# Patient Record
Sex: Female | Born: 1995 | Race: Black or African American | Hispanic: No | Marital: Single | Smoking: Never smoker
Health system: Southern US, Community
[De-identification: ages and names within clinical notes are randomized; demographics above are authoritative.]

---

## 2013-01-10 ENCOUNTER — Emergency Department (HOSPITAL_COMMUNITY)
Admission: EM | Admit: 2013-01-10 | Discharge: 2013-01-10 | Disposition: A | Discharge status: Home / Self Care | Payer: Commercial Managed Care - PPO | Attending: Emergency Medicine | Admitting: Emergency Medicine

## 2013-01-10 ENCOUNTER — Encounter (HOSPITAL_COMMUNITY): Payer: Self-pay | Admitting: Emergency Medicine

## 2013-01-10 ENCOUNTER — Other Ambulatory Visit: Payer: Self-pay

## 2013-01-10 DIAGNOSIS — R05 Cough: Secondary | ICD-10-CM | POA: Insufficient documentation

## 2013-01-10 DIAGNOSIS — J3489 Other specified disorders of nose and nasal sinuses: Secondary | ICD-10-CM | POA: Insufficient documentation

## 2013-01-10 DIAGNOSIS — R079 Chest pain, unspecified: Secondary | ICD-10-CM

## 2013-01-10 DIAGNOSIS — R059 Cough, unspecified: Secondary | ICD-10-CM | POA: Insufficient documentation

## 2013-01-10 DIAGNOSIS — R0789 Other chest pain: Secondary | ICD-10-CM | POA: Insufficient documentation

## 2013-01-10 MED ORDER — HYDROCODONE-ACETAMINOPHEN 5-325 MG PO TABS: 1.0000 | ORAL_TABLET | ORAL | Status: DC | PRN

## 2013-01-10 NOTE — ED Provider Notes (Signed)
CSN: [id number]     Arrival date & time 01/10/13  1829 History   First MD Initiated Contact with Patient 01/10/13 2101     Chief Complaint  Patient presents with  . Sore Throat  . Nasal Congestion   (Consider location/radiation/quality/duration/timing/severity/associated sxs/prior Treatment) HPI History provided by pt.   Has had intermittent, non-radiating, substernal chest pain since 10am today.  Started while sitting in a chair at work.  Can not describe sensation.  Associated w/ SOB.  Sx worsen w/ exertion and sitting/standing up straight.  Non-pleuritic.  Has had mild cough productive of yellow sputum as well a sore throat and nasal congestion for the past 2 days.   Denies fever, hemoptysis, abdominal pain, nausea, LE edema/pain.  No recent trauma.  No h/o GERD.  Is not on OCPs and does not smoke cigarettes.  History reviewed. No pertinent past medical history. History reviewed. No pertinent past surgical history. No family history on file. History  Substance Use Topics  . Smoking status: Never Smoker   . Smokeless tobacco: Not on file  . Alcohol Use: No   OB History   Grav Para Term Preterm Abortions TAB SAB Ect Mult Living                 Review of Systems  All other systems reviewed and are negative.    Allergies  Review of patient's allergies indicates no known allergies.  Home Medications  No current outpatient prescriptions on file. BP 129/78  Pulse 90  Temp(Src) 98.2 F (36.8 C) (Oral)  Resp 19  Ht 5\' 9"  (1.753 m)  Wt 155 lb (70.308 kg)  BMI 22.88 kg/m2  SpO2 99%  LMP 12/15/2012 Physical Exam  Nursing note and vitals reviewed. Constitutional: She is oriented to person, place, and time. She appears well-developed and well-nourished. No distress.  HENT:  Head: Normocephalic and atraumatic.  Posterior pharynx and tonsils erythematous.  Exudate L tonsil.  Uvula mid-line and no trismus.    Eyes:  Normal appearance  Neck: Normal range of motion.   Cardiovascular: Normal rate and regular rhythm.   Pulmonary/Chest: Effort normal and breath sounds normal. No respiratory distress.  Mild tenderness over sternum  Musculoskeletal: Normal range of motion.  No LE edema/ttp  Lymphadenopathy:    She has no cervical adenopathy.  Neurological: She is alert and oriented to person, place, and time.  Skin: Skin is warm and dry. No rash noted.  Psychiatric: She has a normal mood and affect. Her behavior is normal.    ED Course  Procedures (including critical care time) Labs Review Labs Reviewed  RAPID STREP SCREEN  CULTURE, GROUP A STREP   Imaging Review No results found.  MDM   1. Chest pain    17yo healthy F presents w/ atraumatic CP and SOB since this morning.  Has had mild cough for the past few days. Pain reproducible on exam.  EKG non-ischemic and otherwise unremarkable.  History not consistent w/ and no exam findings concerning for PE.  Etiology unclear but pain likely musculoskeletal secondary to coughing.  Prescribed 10 vicodin for pain and recommended warm compresses and avoidance of aggravating activities.  Advised against NSAIDs in case this is indigestion.  Referred to primary care.  Return precautions discussed.     Otilio Miu, PA-C 01/11/13 [phone]

## 2013-01-10 NOTE — ED Notes (Addendum)
Reports Tuesday night, her throat was hurting, wed-started to feel worse; pt reports now its hard to swallow and getting more painful; pt having yellow sputum; pt reports sob started and when cough/inhaling feels like having pain; pt has not checked temp but had chills

## 2013-01-11 NOTE — ED Provider Notes (Signed)
Medical screening examination/treatment/procedure(s) were performed by non-physician practitioner and as supervising physician I was immediately available for consultation/collaboration.   William Tamanika Heiney, MD 01/11/13 2302 

## 2013-01-12 LAB — CULTURE, GROUP A STREP

## 2013-01-13 NOTE — ED Notes (Signed)
Post ED Visit - Positive Culture Follow-up: Successful Patient Follow-Up  Culture assessed and recommendations reviewed by: [x]  Wes Dulaney, Pharm.D., BCPS []  Celedonio Miyamoto, Pharm.D., BCPS []  Georgina Pillion, [address].D., BCPS []  [place], [address].D., BCPS, AAHIVP []  Estella Husk, Pharm.D., BCPS, AAHIVP  Positive strep culture  []  Patient discharged without antimicrobial prescription and treatment is now indicated [x]  Organism is resistant to prescribed ED discharge antimicrobial []  Patient with positive blood cultures  Changes discussed with ED provider: Rhea Bleacher New antibiotic prescription Amoxicillin 500 mg BID x 7 days   Larena Sox 01/13/2013, 3:40 PM

## 2013-01-13 NOTE — Progress Notes (Signed)
ED Antimicrobial Stewardship Positive Culture Follow Up   Leslie Mays is an 17 y.o. female who presented to [facility] on 01/10/2013 with a chief complaint of  Chief Complaint  Patient presents with  . Sore Throat  . Nasal Congestion    Recent Results (from the past 720 hour(s))  RAPID STREP SCREEN     Status: None   Collection Time    01/10/13  9:27 PM      Result Value Range Status   Streptococcus, Group A Screen (Direct) NEGATIVE  NEGATIVE Final   Comment: (NOTE)     A Rapid Antigen test may result negative if the antigen level in the     sample is below the detection level of this test. The FDA has not     cleared this test as a stand-alone test therefore the rapid antigen     negative result has reflexed to a Group A Strep culture.  CULTURE, GROUP A STREP     Status: None   Collection Time    01/10/13  9:27 PM      Result Value Range Status   Specimen Description THROAT   Final   Special Requests NONE   Final   Culture     Final   Value: STREPTOCOCCUS,BETA HEMOLYIC NOT GROUP A     Performed at [facility]   Report Status 01/12/2013 FINAL   Final     [x]  Patient discharged originally without antimicrobial agent and treatment is now indicated  Recommendation: Perform symptom check. If symptoms are resolving, no treatment is indicated. If symptoms persist/worsening, start Amoxicillin 500mg  PO BID x 7 days.  ED Provider: Rhea Bleacher, PA-C   Cleon Dew 01/13/2013, 4:39 PM Infectious Diseases Pharmacist Phone# [phone]

## 2013-01-16 NOTE — ED Notes (Signed)
Patient feel better/no treatment indicated.

## 2014-05-19 ENCOUNTER — Emergency Department (HOSPITAL_COMMUNITY): Payer: Commercial Managed Care - PPO

## 2014-05-19 ENCOUNTER — Telehealth: Payer: Self-pay | Admitting: *Deleted

## 2014-05-19 ENCOUNTER — Emergency Department (HOSPITAL_COMMUNITY)
Admission: EM | Admit: 2014-05-19 | Discharge: 2014-05-19 | Disposition: A | Discharge status: Home / Self Care | Payer: Commercial Managed Care - PPO | Attending: Emergency Medicine | Admitting: Emergency Medicine

## 2014-05-19 ENCOUNTER — Encounter (HOSPITAL_COMMUNITY): Payer: Self-pay | Admitting: Physical Medicine and Rehabilitation

## 2014-05-19 DIAGNOSIS — R102 Pelvic and perineal pain: Secondary | ICD-10-CM | POA: Diagnosis not present

## 2014-05-19 DIAGNOSIS — R1030 Lower abdominal pain, unspecified: Secondary | ICD-10-CM | POA: Diagnosis present

## 2014-05-19 DIAGNOSIS — Z79899 Other long term (current) drug therapy: Secondary | ICD-10-CM | POA: Insufficient documentation

## 2014-05-19 DIAGNOSIS — N898 Other specified noninflammatory disorders of vagina: Secondary | ICD-10-CM | POA: Diagnosis not present

## 2014-05-19 DIAGNOSIS — Z3202 Encounter for pregnancy test, result negative: Secondary | ICD-10-CM | POA: Diagnosis not present

## 2014-05-19 LAB — URINALYSIS, ROUTINE W REFLEX MICROSCOPIC
BILIRUBIN URINE: NEGATIVE
Glucose, UA: NEGATIVE mg/dL
HGB URINE DIPSTICK: NEGATIVE
KETONES UR: NEGATIVE mg/dL
Nitrite: NEGATIVE
PROTEIN: NEGATIVE mg/dL
Specific Gravity, Urine: 1.018 (ref 1.005–1.030)
Urobilinogen, UA: 0.2 mg/dL (ref 0.0–1.0)
pH: 6.5 (ref 5.0–8.0)

## 2014-05-19 LAB — POC URINE PREG, ED: Preg Test, Ur: NEGATIVE

## 2014-05-19 LAB — WET PREP, GENITAL
CLUE CELLS WET PREP: NONE SEEN
TRICH WET PREP: NONE SEEN
YEAST WET PREP: NONE SEEN

## 2014-05-19 LAB — URINE MICROSCOPIC-ADD ON

## 2014-05-19 MED ORDER — CEFTRIAXONE SODIUM 250 MG IJ SOLR
250.0000 mg | Freq: Once | INTRAMUSCULAR | Status: AC
  Administered 2014-05-19: 250 mg via INTRAMUSCULAR
  Filled 2014-05-19: qty 250

## 2014-05-19 MED ORDER — ONDANSETRON 4 MG PO TBDP
8.0000 mg | ORAL_TABLET | Freq: Once | ORAL | Status: AC
  Administered 2014-05-19: 8 mg via ORAL
  Filled 2014-05-19: qty 2

## 2014-05-19 MED ORDER — HYDROCODONE-ACETAMINOPHEN 5-325 MG PO TABS: 1.0000 | ORAL_TABLET | Freq: Four times a day (QID) | ORAL | Status: DC | PRN

## 2014-05-19 MED ORDER — AZITHROMYCIN 250 MG PO TABS
1000.0000 mg | ORAL_TABLET | Freq: Once | ORAL | Status: AC
  Administered 2014-05-19: 1000 mg via ORAL
  Filled 2014-05-19: qty 4

## 2014-05-19 MED ORDER — HYDROCODONE-ACETAMINOPHEN 5-325 MG PO TABS
2.0000 | ORAL_TABLET | Freq: Once | ORAL | Status: AC
  Administered 2014-05-19: 2 via ORAL
  Filled 2014-05-19: qty 2

## 2014-05-19 NOTE — Discharge Instructions (Signed)
Hydrocodone as prescribed as needed for pain.  Follow-up with your primary Dr. if not improving in the next few days, and return to the ER if your symptoms significantly worsen or change.   Pelvic Pain Female pelvic pain can be caused by many different things and start from a variety of places. Pelvic pain refers to pain that is located in the lower half of the abdomen and between your hips. The pain may occur over a short period of time (acute) or may be reoccurring (chronic). The cause of pelvic pain may be related to disorders affecting the female reproductive organs (gynecologic), but it may also be related to the bladder, kidney stones, an intestinal complication, or muscle or skeletal problems. Getting help right away for pelvic pain is important, especially if there has been severe, sharp, or a sudden onset of unusual pain. It is also important to get help right away because some types of pelvic pain can be life threatening.  CAUSES  Below are only some of the causes of pelvic pain. The causes of pelvic pain can be in one of several categories.   Gynecologic.  Pelvic inflammatory disease.  Sexually transmitted infection.  Ovarian cyst or a twisted ovarian ligament (ovarian torsion).  Uterine lining that grows outside the uterus (endometriosis).  Fibroids, cysts, or tumors.  Ovulation.  Pregnancy.  Pregnancy that occurs outside the uterus (ectopic pregnancy).  Miscarriage.  Labor.  Abruption of the placenta or ruptured uterus.  Infection.  Uterine infection (endometritis).  Bladder infection.  Diverticulitis.  Miscarriage related to a uterine infection (septic abortion).  Bladder.  Inflammation of the bladder (cystitis).  Kidney stone(s).  Gastrointestinal.  Constipation.  Diverticulitis.  Neurologic.  Trauma.  Feeling pelvic pain because of mental or emotional causes (psychosomatic).  Cancers of the bowel or pelvis. EVALUATION  Your caregiver  will want to take a careful history of your concerns. This includes recent changes in your health, a careful gynecologic history of your periods (menses), and a sexual history. Obtaining your family history and medical history is also important. Your caregiver may suggest a pelvic exam. A pelvic exam will help identify the location and severity of the pain. It also helps in the evaluation of which organ system may be involved. In order to identify the cause of the pelvic pain and be properly treated, your caregiver may order tests. These tests may include:   A pregnancy test.  Pelvic ultrasonography.  An X-ray exam of the abdomen.  A urinalysis or evaluation of vaginal discharge.  Blood tests. HOME CARE INSTRUCTIONS   Only take over-the-counter or prescription medicines for pain, discomfort, or fever as directed by your caregiver.   Rest as directed by your caregiver.   Eat a balanced diet.   Drink enough fluids to make your urine clear or pale yellow, or as directed.   Avoid sexual intercourse if it causes pain.   Apply warm or cold compresses to the lower abdomen depending on which one helps the pain.   Avoid stressful situations.   Keep a journal of your pelvic pain. Write down when it started, where the pain is located, and if there are things that seem to be associated with the pain, such as food or your menstrual cycle.  Follow up with your caregiver as directed.  SEEK MEDICAL CARE IF:  Your medicine does not help your pain.  You have abnormal vaginal discharge. SEEK IMMEDIATE MEDICAL CARE IF:   You have heavy bleeding from the vagina.  Your pelvic pain increases.   You feel light-headed or faint.   You have chills.   You have pain with urination or blood in your urine.   You have uncontrolled diarrhea or vomiting.   You have a fever or persistent symptoms for more than 3 days.  You have a fever and your symptoms suddenly get worse.   You are  being physically or sexually abused.  MAKE SURE YOU:  Understand these instructions.  Will watch your condition.  Will get help if you are not doing well or get worse. Document Released: 03/14/2004 Document Revised: 09/01/2013 Document Reviewed: 08/07/2011 [facility] Patient Information 2015 [place], [place]. This information is not intended to replace advice given to you by your health care provider. Make sure you discuss any questions you have with your health care provider.

## 2014-05-19 NOTE — ED Provider Notes (Signed)
CSN: [id number]     Arrival date & time 05/19/14  4098 History   First MD Initiated Contact with Patient 05/19/14 0945     Chief Complaint  Patient presents with  . Abdominal Pain  . Vaginal Discharge     (Consider location/radiation/quality/duration/timing/severity/associated sxs/prior Treatment) HPI Comments: Patient is a 19 year old female who presents with complaints of suprapubic discomfort for the past several days. She is also reporting a vaginal discharge that is white in color. She has tried Monistat bleeding she has had a yeast infection, however this has not helped. She denies any difficulty urinating. Her last menstrual period started on Christmas Eve and lasted for several days. She is sexually active but denies any new partner. She has had BV in the past and believes this may feel the same.  Patient is a 19 y.o. female presenting with abdominal pain and vaginal discharge. The history is provided by the patient.  Abdominal Pain Pain location:  Suprapubic Pain quality: cramping   Pain radiates to:  Does not radiate Pain severity:  Moderate Onset quality:  Gradual Duration:  3 days Timing:  Constant Progression:  Worsening Chronicity:  New Relieved by:  Nothing Worsened by:  Movement and position changes Ineffective treatments:  None tried Associated symptoms: vaginal discharge   Vaginal Discharge Associated symptoms: abdominal pain     History reviewed. No pertinent past medical history. History reviewed. No pertinent past surgical history. No family history on file. History  Substance Use Topics  . Smoking status: Never Smoker   . Smokeless tobacco: Not on file  . Alcohol Use: No   OB History    No data available     Review of Systems  Gastrointestinal: Positive for abdominal pain.  Genitourinary: Positive for vaginal discharge.  All other systems reviewed and are negative.     Allergies  Review of patient's allergies indicates no known  allergies.  Home Medications   Prior to Admission medications   Medication Sig Start Date End Date Taking? Authorizing Provider  HYDROcodone-acetaminophen (NORCO/VICODIN) 5-325 MG per tablet Take 1 tablet by mouth every 4 (four) hours as needed for pain. 01/10/13   Arie Sabina Schinlever, PA-C   BP 129/82 mmHg  Pulse 97  Temp(Src) 98.8 F (37.1 C) (Oral)  Resp 18  Ht  (1.753 m)  Wt 170 lb (77.111 kg)  BMI 25.09 kg/m2  SpO2 100% Physical Exam  Constitutional: She is oriented to person, place, and time. She appears well-developed and well-nourished. No distress.  HENT:  Head: Normocephalic and atraumatic.  Neck: Normal range of motion. Neck supple.  Cardiovascular: Normal rate and regular rhythm.  Exam reveals no gallop and no friction rub.   No murmur heard. Pulmonary/Chest: Effort normal and breath sounds normal. No respiratory distress. She has no wheezes.  Abdominal: Soft. Bowel sounds are normal. She exhibits no distension. There is tenderness. There is no rebound and no guarding.  There is suprapubic tenderness to palpation with no rebound and no guarding.  Musculoskeletal: Normal range of motion.  Neurological: She is alert and oriented to person, place, and time.  Skin: Skin is warm and dry. She is not diaphoretic.  Nursing note and vitals reviewed.   ED Course  Procedures (including critical care time) Labs Review Labs Reviewed  WET PREP, GENITAL  URINALYSIS, ROUTINE W REFLEX MICROSCOPIC  POC URINE PREG, ED  GC/CHLAMYDIA PROBE AMP ([place])    Imaging Review No results found.   EKG Interpretation None  MDM   Final diagnoses:  None    Workup reveals no obvious abnormality. Her wet prep only reveals few white cells. She will be treated with Rocephin and Zithromax as ultrasound and remainder of workup has been unable to identify another possible cause. She will be discharged to home with pain medicine and when necessary return.    Geoffery Lyonsouglas  Lelania Bia, MD 05/19/14 [phone]

## 2014-05-19 NOTE — ED Notes (Signed)
Pt presents to department for evaluation of vaginal discharge and lower abdominal cramping. Ongoing for several days.

## 2014-05-19 NOTE — Telephone Encounter (Signed)
Pt called stating that prescription was not called into pharmacy as promised.  NCM informed her that all prescriptions are paper from ER and none are written.

## 2014-05-20 ENCOUNTER — Telehealth (HOSPITAL_BASED_OUTPATIENT_CLINIC_OR_DEPARTMENT_OTHER): Payer: Self-pay | Admitting: Emergency Medicine

## 2014-05-20 LAB — GC/CHLAMYDIA PROBE AMP (~~LOC~~) NOT AT ARMC
Chlamydia: POSITIVE — AB
Neisseria Gonorrhea: NEGATIVE

## 2014-05-21 ENCOUNTER — Telehealth (HOSPITAL_COMMUNITY): Payer: Self-pay

## 2014-05-21 NOTE — Telephone Encounter (Signed)
LVM asking pt to return call.  Pt needs to be informed she was treated approp. for chlamydia while here.

## 2014-08-11 ENCOUNTER — Other Ambulatory Visit (HOSPITAL_COMMUNITY)
Admission: RE | Admit: 2014-08-11 | Discharge: 2014-08-11 | Disposition: A | Discharge status: Home / Self Care | Payer: 59 | Source: Ambulatory Visit | Attending: Nurse Practitioner | Admitting: Nurse Practitioner

## 2014-08-11 ENCOUNTER — Other Ambulatory Visit: Payer: Self-pay | Admitting: Nurse Practitioner

## 2014-08-11 DIAGNOSIS — Z01419 Encounter for gynecological examination (general) (routine) without abnormal findings: Secondary | ICD-10-CM | POA: Diagnosis present

## 2014-08-11 DIAGNOSIS — Z113 Encounter for screening for infections with a predominantly sexual mode of transmission: Secondary | ICD-10-CM | POA: Diagnosis present

## 2014-08-14 LAB — CYTOLOGY - PAP

## 2016-03-13 ENCOUNTER — Ambulatory Visit (INDEPENDENT_AMBULATORY_CARE_PROVIDER_SITE_OTHER): Payer: PRIVATE HEALTH INSURANCE | Admitting: Urgent Care

## 2016-03-13 VITALS — BP 112/72 | HR 83 | Temp 98.2°F | Resp 17 | Ht 69.0 in | Wt 182.0 lb

## 2016-03-13 DIAGNOSIS — R05 Cough: Secondary | ICD-10-CM | POA: Diagnosis not present

## 2016-03-13 DIAGNOSIS — R0982 Postnasal drip: Secondary | ICD-10-CM | POA: Diagnosis not present

## 2016-03-13 DIAGNOSIS — R0981 Nasal congestion: Secondary | ICD-10-CM | POA: Diagnosis not present

## 2016-03-13 DIAGNOSIS — R059 Cough, unspecified: Secondary | ICD-10-CM

## 2016-03-13 MED ORDER — BENZONATATE 100 MG PO CAPS: 100.0000 mg | ORAL_CAPSULE | Freq: Three times a day (TID) | ORAL | 0 refills | Status: AC | PRN

## 2016-03-13 MED ORDER — PSEUDOEPHEDRINE HCL ER 120 MG PO TB12: 120.0000 mg | ORAL_TABLET | Freq: Two times a day (BID) | ORAL | 3 refills | Status: AC

## 2016-03-13 MED ORDER — LORATADINE 10 MG PO TABS: 10.0000 mg | ORAL_TABLET | Freq: Every day | ORAL | 11 refills | Status: AC

## 2016-03-13 MED ORDER — FLUTICASONE PROPIONATE 50 MCG/ACT NA SUSP: 2.0000 | Freq: Every day | NASAL | 11 refills | Status: AC

## 2016-03-13 NOTE — Patient Instructions (Addendum)
Cough, Adult Coughing is a reflex that clears your throat and your airways. Coughing helps to heal and protect your lungs. It is normal to cough occasionally, but a cough that happens with other symptoms or lasts a long time may be a sign of a condition that needs treatment. A cough may last only 2-3 weeks (acute), or it may last longer than 8 weeks (chronic). CAUSES Coughing is commonly caused by:  Breathing in substances that irritate your lungs.  A viral or bacterial respiratory infection.  Allergies.  Asthma.  Postnasal drip.  Smoking.  Acid backing up from the stomach into the esophagus (gastroesophageal reflux).  Certain medicines.  Chronic lung problems, including COPD (or rarely, lung cancer).  Other medical conditions such as heart failure. HOME CARE INSTRUCTIONS  Pay attention to any changes in your symptoms. Take these actions to help with your discomfort:  Take medicines only as told by your health care provider.  If you were prescribed an antibiotic medicine, take it as told by your health care provider. Do not stop taking the antibiotic even if you start to feel better.  Talk with your health care provider before you take a cough suppressant medicine.  Drink enough fluid to keep your urine clear or pale yellow.  If the air is dry, use a cold steam vaporizer or humidifier in your bedroom or your home to help loosen secretions.  Avoid anything that causes you to cough at work or at home.  If your cough is worse at night, try sleeping in a semi-upright position.  Avoid cigarette smoke. If you smoke, quit smoking. If you need help quitting, ask your health care provider.  Avoid caffeine.  Avoid alcohol.  Rest as needed. SEEK MEDICAL CARE IF:   You have new symptoms.  You cough up pus.  Your cough does not get better after 2-3 weeks, or your cough gets worse.  You cannot control your cough with suppressant medicines and you are losing sleep.  You  develop pain that is getting worse or pain that is not controlled with pain medicines.  You have a fever.  You have unexplained weight loss.  You have night sweats. SEEK IMMEDIATE MEDICAL CARE IF:  You cough up blood.  You have difficulty breathing.  Your heartbeat is very fast.   This information is not intended to replace advice given to you by your health care provider. Make sure you discuss any questions you have with your health care provider.   Document Released: 10/14/2010 Document Revised: 01/06/2015 Document Reviewed: 06/24/2014 Elsevier Interactive Patient Education 2016 [employer]     IF you received an x-ray today, you will receive an invoice from [facility]. Please contact [facility] at [phone] with questions or concerns regarding your invoice.   IF you received labwork today, you will receive an invoice from [facility]. Please contact [facility] at [phone] with questions or concerns regarding your invoice.   Our billing staff will not be able to assist you with questions regarding bills from these companies.  You will be contacted with the lab results as soon as they are available. The fastest way to get your results is to activate your My Chart account. Instructions are located on the last page of this paperwork. If you have not heard from us regarding the results in 2 weeks, please contact this office.      

## 2016-03-13 NOTE — Progress Notes (Signed)
    MRN: [id number] DOB: Dec 05, 1995  Subjective:   Leslie Mays is a 20 y.o. female presenting for chief complaint of Cough (and congestion x2weeks. )  Reports 2 week history of productive, sinus and throat congestion. Also has sore throat in the morning, post-nasal drainage, fatigue. Has tried DayQuil with minimal relief. Denies fever, chest pain, shob, wheezing, n/v, abdominal pain, rashes, myalgia. Does not get flu shots. Has daily long-standing history of sinus drainage. Denies history of asthma. Denies smoking cigarettes.   Leslie Mays is not currently taking any medications. Also has No Known Allergies.  Leslie Mays denies past medical history. Denies past surgical history.  Objective:   Vitals: BP 112/72 (BP Location: Left Arm, Patient Position: Sitting, Cuff Size: Normal)   Pulse 83   Temp 98.2 F (36.8 C) (Oral)   Resp 17   Ht 5\' 9"  (1.753 m)   Wt 182 lb (82.6 kg)   LMP 02/07/2016   SpO2 99%   BMI 26.88 kg/m   Physical Exam  Constitutional: She is oriented to person, place, and time. She appears well-developed and well-nourished.  HENT:  TM's flat bilaterally but no effusions or erythema. Nasal turbinates boggy and violaceous without sinus tenderness. Postnasal drip present but without oropharyngeal exudates, erythema or abscesses.  Eyes: Right eye exhibits no discharge. Left eye exhibits no discharge. No scleral icterus.  Neck: Normal range of motion. Neck supple.  Cardiovascular: Normal rate, regular rhythm and intact distal pulses.  Exam reveals no gallop and no friction rub.   No murmur heard. Pulmonary/Chest: No respiratory distress. She has no wheezes. She has no rales.  Lymphadenopathy:    She has no cervical adenopathy.  Neurological: She is alert and oriented to person, place, and time.  Skin: Skin is warm and dry.   Assessment and Plan :   1. Cough 2. Nasal congestion 3. Post-nasal drainage - Likely having residual symptoms from viral URI with cough worsened by  uncontrolled allergies. Discussed management with Claritin, Flonase, Sudafed as needed. Tessalon for cough suppression. - If no improvement or symptoms do not resolve return to clinic in 03/17/2016.   Wallis BambergMario Wilena Tyndall, PA-C [facility] [phone] 03/13/2016 1:56 PM

## 2016-04-17 ENCOUNTER — Encounter (HOSPITAL_COMMUNITY): Payer: Self-pay | Admitting: *Deleted

## 2016-04-17 ENCOUNTER — Emergency Department (HOSPITAL_COMMUNITY): Payer: 59

## 2016-04-17 ENCOUNTER — Emergency Department (HOSPITAL_COMMUNITY)
Admission: EM | Admit: 2016-04-17 | Discharge: 2016-04-18 | Disposition: A | Discharge status: Home / Self Care | Payer: 59 | Attending: Emergency Medicine | Admitting: Emergency Medicine

## 2016-04-17 DIAGNOSIS — W268XXA Contact with other sharp object(s), not elsewhere classified, initial encounter: Secondary | ICD-10-CM | POA: Insufficient documentation

## 2016-04-17 DIAGNOSIS — Y939 Activity, unspecified: Secondary | ICD-10-CM | POA: Insufficient documentation

## 2016-04-17 DIAGNOSIS — Y999 Unspecified external cause status: Secondary | ICD-10-CM | POA: Diagnosis not present

## 2016-04-17 DIAGNOSIS — S61212A Laceration without foreign body of right middle finger without damage to nail, initial encounter: Secondary | ICD-10-CM | POA: Diagnosis present

## 2016-04-17 DIAGNOSIS — Y929 Unspecified place or not applicable: Secondary | ICD-10-CM | POA: Diagnosis not present

## 2016-04-17 MED ORDER — LIDOCAINE-EPINEPHRINE (PF) 2 %-1:200000 IJ SOLN
10.0000 mL | Freq: Once | INTRAMUSCULAR | Status: AC
  Administered 2016-04-18: 10 mL
  Filled 2016-04-17: qty 20

## 2016-04-17 MED ORDER — ACETAMINOPHEN 325 MG PO TABS
650.0000 mg | ORAL_TABLET | Freq: Once | ORAL | Status: AC
  Administered 2016-04-17: 650 mg via ORAL
  Filled 2016-04-17: qty 2

## 2016-04-17 MED ORDER — TETANUS-DIPHTH-ACELL PERTUSSIS 5-2.5-18.5 LF-MCG/0.5 IM SUSP
0.5000 mL | Freq: Once | INTRAMUSCULAR | Status: AC
  Administered 2016-04-17: 0.5 mL via INTRAMUSCULAR
  Filled 2016-04-17: qty 0.5

## 2016-04-17 NOTE — ED Notes (Signed)
Lidocaine and suture cart at bedside.  

## 2016-04-17 NOTE — ED Provider Notes (Signed)
[facility]-EMERGENCY DEPT Provider Note   CSN: [id number] Arrival date & time: 04/17/16  1928     History   Chief Complaint Chief Complaint  Patient presents with  . Laceration    HPI Arletta BaleLacey Bohanon is a 20 y.o. female.  Arletta BaleLacey Harbeck is a 20 y.o. female with no pertinent medical history presents to ED with complaint of laceration to right middle finger onset PTA. Patient was moving when a wire hanger cut her middle finger. She was seen at the urgent care and sent here for further evaluation. Bleeding is controlled. Not on anti-coagulation therapy. She complains of associated throbbing pain and swelling at PIP joint. Denies fever, numbness, or immunocompromising conditions. Unknown last tetanus. No treatments tried PTA.       History reviewed. No pertinent past medical history.  There are no active problems to display for this patient.   History reviewed. No pertinent surgical history.  OB History    No data available       Home Medications    Prior to Admission medications   Medication Sig Start Date End Date Taking? Authorizing Provider  benzonatate (TESSALON) 100 MG capsule Take 1-2 capsules (100-200 mg total) by mouth 3 (three) times daily as needed for cough. 03/13/16   Wallis BambergMario Mani, PA-C  cephALEXin (KEFLEX) 500 MG capsule Take 1 capsule (500 mg total) by mouth 2 (two) times daily. 04/18/16 04/23/16  Lona KettleAshley Laurel Hughie Melroy, PA-C  fluticasone (FLONASE) 50 MCG/ACT nasal spray Place 2 sprays into both nostrils daily. 03/13/16   Wallis BambergMario Mani, PA-C  loratadine (CLARITIN) 10 MG tablet Take 1 tablet (10 mg total) by mouth daily. 03/13/16   Wallis BambergMario Mani, PA-C  pseudoephedrine (SUDAFED 12 HOUR) 120 MG 12 hr tablet Take 1 tablet (120 mg total) by mouth 2 (two) times daily. 03/13/16   Wallis BambergMario Mani, PA-C    Family History No family history on file.  Social History Social History  Substance Use Topics  . Smoking status: Never Smoker  . Smokeless tobacco: Never Used  . Alcohol use No      Allergies   Patient has no known allergies.   Review of Systems Review of Systems  Constitutional: Negative for fever.  Musculoskeletal: Positive for arthralgias and joint swelling.  Skin: Positive for wound.  Allergic/Immunologic: Positive for immunocompromised state.  Neurological: Negative for numbness.     Physical Exam Updated Vital Signs BP 107/72 (BP Location: Right Arm)   Pulse 76   Temp 98.7 F (37.1 C) (Oral)   Resp 16   Ht 5\' 9"  (1.753 m)   Wt 77.1 kg   LMP 03/20/2016   SpO2 99%   BMI 25.10 kg/m   Physical Exam  Constitutional: She appears well-developed and well-nourished. No distress.  HENT:  Head: Normocephalic and atraumatic.  Eyes: Conjunctivae are normal. No scleral icterus.  Neck: Normal range of motion.  Cardiovascular: Normal rate.   Pulmonary/Chest: Effort normal. No respiratory distress.  Abdominal: She exhibits no distension.  Musculoskeletal:       Right hand: She exhibits laceration.  Right middle digit: 1cm laceration to PIP. Mild swelling of PIP noted. No obvious foreign body. Patient able to ROM at MCP, PIP. Decreased DIP due to patient reporting pain. Sensation intact. Capillary refill <3seconds.   Neurological: She is alert.  Skin: Skin is warm and dry. She is not diaphoretic.  Psychiatric: She has a normal mood and affect. Her behavior is normal.     ED Treatments / Results  Labs (all labs ordered  are listed, but only abnormal results are displayed) Labs Reviewed - No data to display  EKG  EKG Interpretation None       Radiology Dg Finger Middle Right  Result Date: 04/17/2016 CLINICAL DATA:  20 year old female with laceration of the right middle finger. EXAM: RIGHT MIDDLE FINGER 2+V COMPARISON:  None. FINDINGS: There is no acute fracture or dislocation. The bones are well mineralized. No arthritic changes. Mild soft tissue swelling about the PIP of the middle digit. No radiopaque foreign object or soft tissue gas.  IMPRESSION: No acute/traumatic osseous pathology. No radiopaque foreign object or soft tissue gas. Electronically Signed   By: Elgie Collard M.D.   On: 04/17/2016 23:07    Procedures Procedures (including critical care time) LACERATION REPAIR Performed by: Lona Kettle Authorized by: Lona Kettle Consent: Verbal consent obtained. Risks and benefits: risks, benefits and alternatives were discussed Consent given by: patient Patient identity confirmed: provided demographic data Prepped and Draped in normal sterile fashion Wound explored  Laceration Location: PIP of 3rd digit of right hand  Laceration Length: 1cm  No Foreign Bodies seen or palpated  Anesthesia: local infiltration  Local anesthetic: lidocaine 1% w/ epinephrine  Anesthetic total: 3 ml  Irrigation method: syringe Amount of cleaning: standard  Skin closure: dermal  Number of sutures: 4  Technique: simple interrupted  Patient tolerance: Patient tolerated the procedure well with no immediate complications.  Medications Ordered in ED Medications  acetaminophen (TYLENOL) tablet 650 mg (650 mg Oral Given 04/17/16 2219)  Tdap (BOOSTRIX) injection 0.5 mL (0.5 mLs Intramuscular Given 04/17/16 2336)  lidocaine-EPINEPHrine (XYLOCAINE W/EPI) 2 %-1:200000 (PF) injection 10 mL (10 mLs Infiltration Given 04/18/16 0027)  ondansetron (ZOFRAN-ODT) disintegrating tablet 8 mg (8 mg Oral Given 04/18/16 0026)  cephALEXin (KEFLEX) capsule 500 mg (500 mg Oral Given 04/18/16 0123)  bacitracin ointment (1 application Topical Given 04/18/16 0123)     Initial Impression / Assessment and Plan / ED Course  I have reviewed the triage vital signs and the nursing notes.  Pertinent labs & imaging results that were available during my care of the patient were reviewed by me and considered in my medical decision making (see chart for details).  Clinical Course as of Apr 19 147  Mon Apr 17, 2016  2330 DG Finger Middle  Right [AM]    Clinical Course User Index [AM] Lona Kettle, PA-C    Patient presents to ED with complaint of laceration to right 3rd digit s/p injury with wire hanger. Patient is afebrile and non-toxic appearing in NAD. VSS. 1cm laceration at PIP of right 3rd digit. Neurovascularly intact. X-ray negative for obvious fracture, dislocation, foreign body, or soft tissue gas. Pain medication given.    Pressure irrigation performed by me. Wound explored and base of wound visualized in a bloodless field without evidence of foreign body. Laceration occurred < 8 hours prior to repair which was well tolerated.  Tdap updated.  Pt has  no comorbidities to effect normal wound healing. Pt d/c'd with ABX.  Discussed suture home care with patient and answered questions. Pt to follow-up for wound check and suture removal in 10-14 days; they are to return to the ED sooner for signs of infection. Return precautions given. Pt voiced understanding and is agreeable.  Pt is hemodynamically stable with no complaints prior to dc.    Final Clinical Impressions(s) / ED Diagnoses   Final diagnoses:  Laceration of right middle finger without foreign body without damage to nail, initial encounter  New Prescriptions Discharge Medication List as of 04/18/2016  1:09 AM    START taking these medications   Details  cephALEXin (KEFLEX) 500 MG capsule Take 1 capsule (500 mg total) by mouth 2 (two) times daily., Starting Tue 04/18/2016, Until Sun 04/23/2016, Print         Lona KettleAshley Laurel Demia Viera, PA-C 04/18/16 0150    Tilden FossaElizabeth Rees, MD 04/19/16 [phone]

## 2016-04-17 NOTE — ED Triage Notes (Signed)
Pt repots she was moving today and cut her R middle finger with a hanger.  Went to [facility] and was sent here d/t the depth of the cut.

## 2016-04-18 MED ORDER — CEPHALEXIN 500 MG PO CAPS: 500.0000 mg | ORAL_CAPSULE | Freq: Two times a day (BID) | ORAL | 0 refills | Status: AC

## 2016-04-18 MED ORDER — BACITRACIN ZINC 500 UNIT/GM EX OINT
TOPICAL_OINTMENT | Freq: Two times a day (BID) | CUTANEOUS | Status: AC
  Administered 2016-04-18: 1 via TOPICAL
  Filled 2016-04-18: qty 0.9

## 2016-04-18 MED ORDER — CEPHALEXIN 500 MG PO CAPS
500.0000 mg | ORAL_CAPSULE | Freq: Once | ORAL | Status: AC
  Administered 2016-04-18: 500 mg via ORAL
  Filled 2016-04-18: qty 1

## 2016-04-18 MED ORDER — ONDANSETRON 8 MG PO TBDP
8.0000 mg | ORAL_TABLET | Freq: Once | ORAL | Status: AC
  Administered 2016-04-18: 8 mg via ORAL
  Filled 2016-04-18: qty 1

## 2016-04-18 NOTE — Discharge Instructions (Signed)
Read the information below.  Your x-ray did not show any obvious fracture, dislocation, or foreign body. You received 4 stitches. Keep dressing on for 24 hours. Following wash with warm soap and water, apply antibiotic ointment, and new dressing. Wear brace to minimize movement so that the stitches do not open. Stitches will need to be removed in 10-14 days.  I have prescribed antibiotics to prevent infection.  Keep a look out for evidence of infection - redness, swelling, warmth, drainage, streaking, or fever - if any of these signs return to ED immediately.  Please follow up with your regular doctor in 2-3 days for a wound recheck.  Use the prescribed medication as directed.  Please discuss all new medications with your pharmacist.   You may return to the Emergency Department at any time for worsening condition or any new symptoms that concern you.

## 2017-11-12 IMAGING — CR DG FINGER MIDDLE 2+V*R*
3 series · 3 of 3 positions shown · non-contrast
Comparison: None.

CLINICAL DATA: 20-year-old female with laceration of the right
middle finger.

EXAM:
RIGHT MIDDLE FINGER 2+V

[x finger pa right]
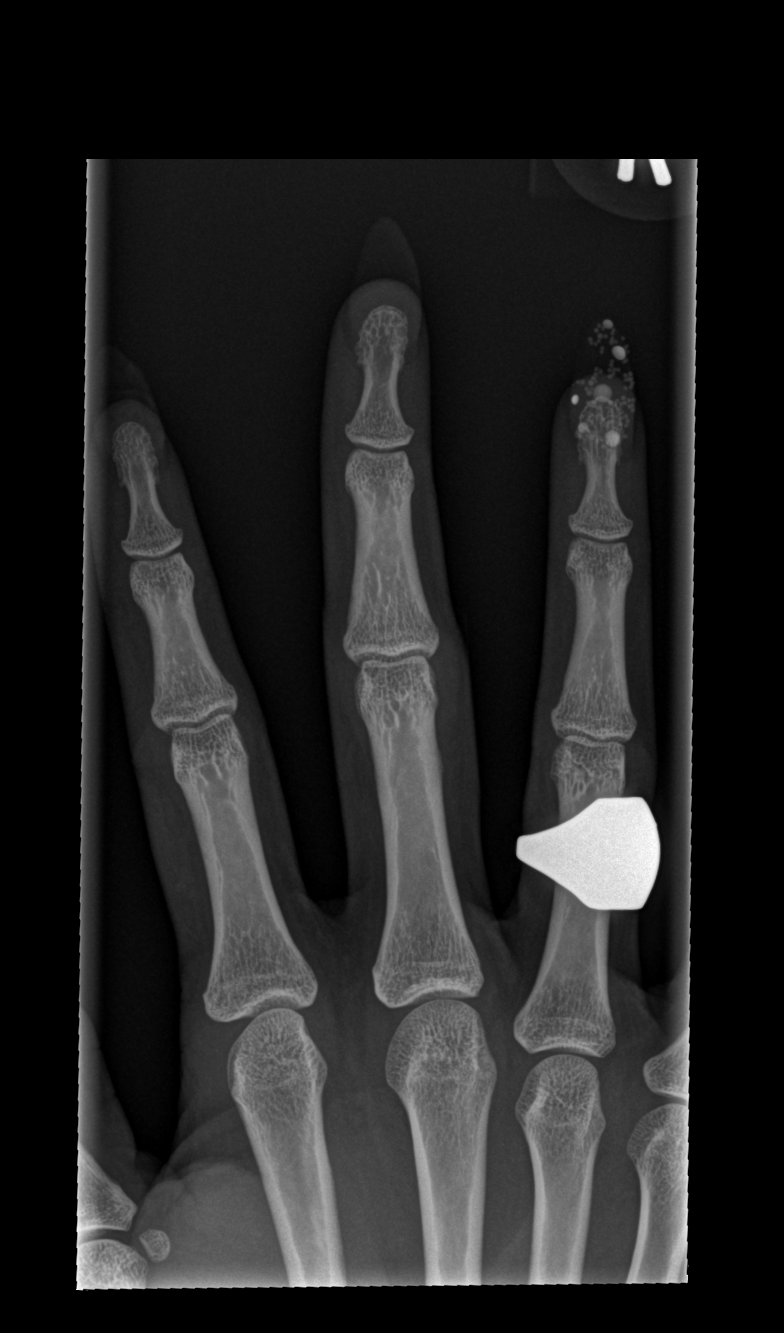

[x finger obl right]
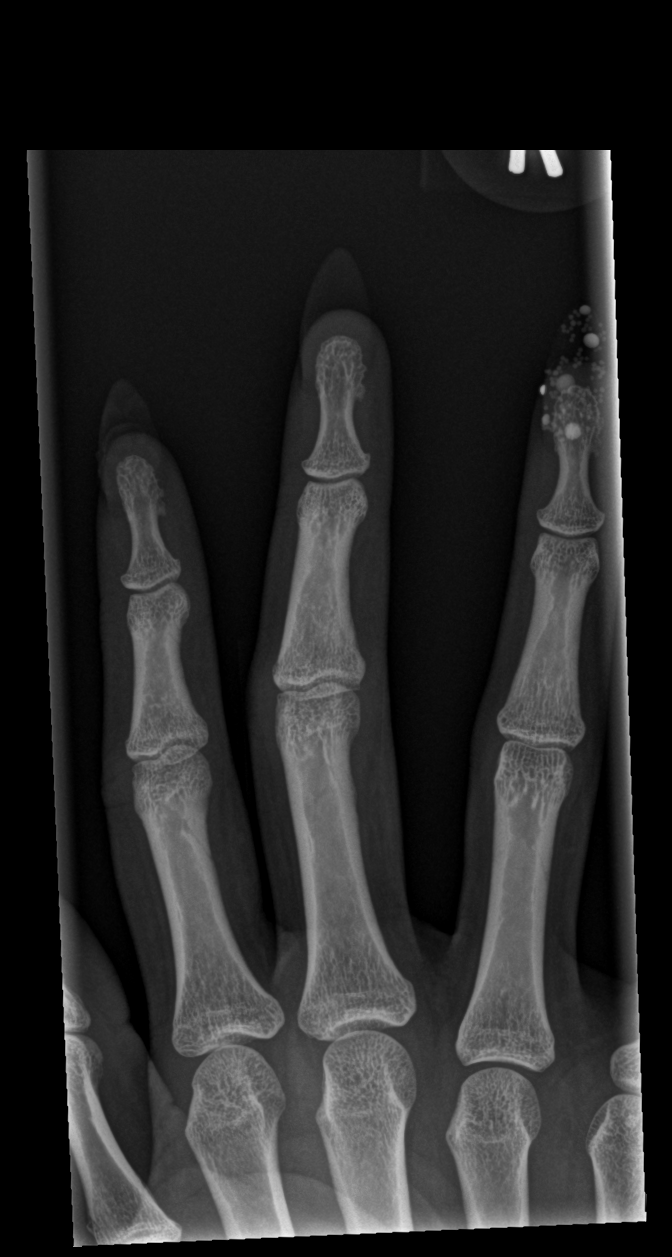

[x finger lat right]
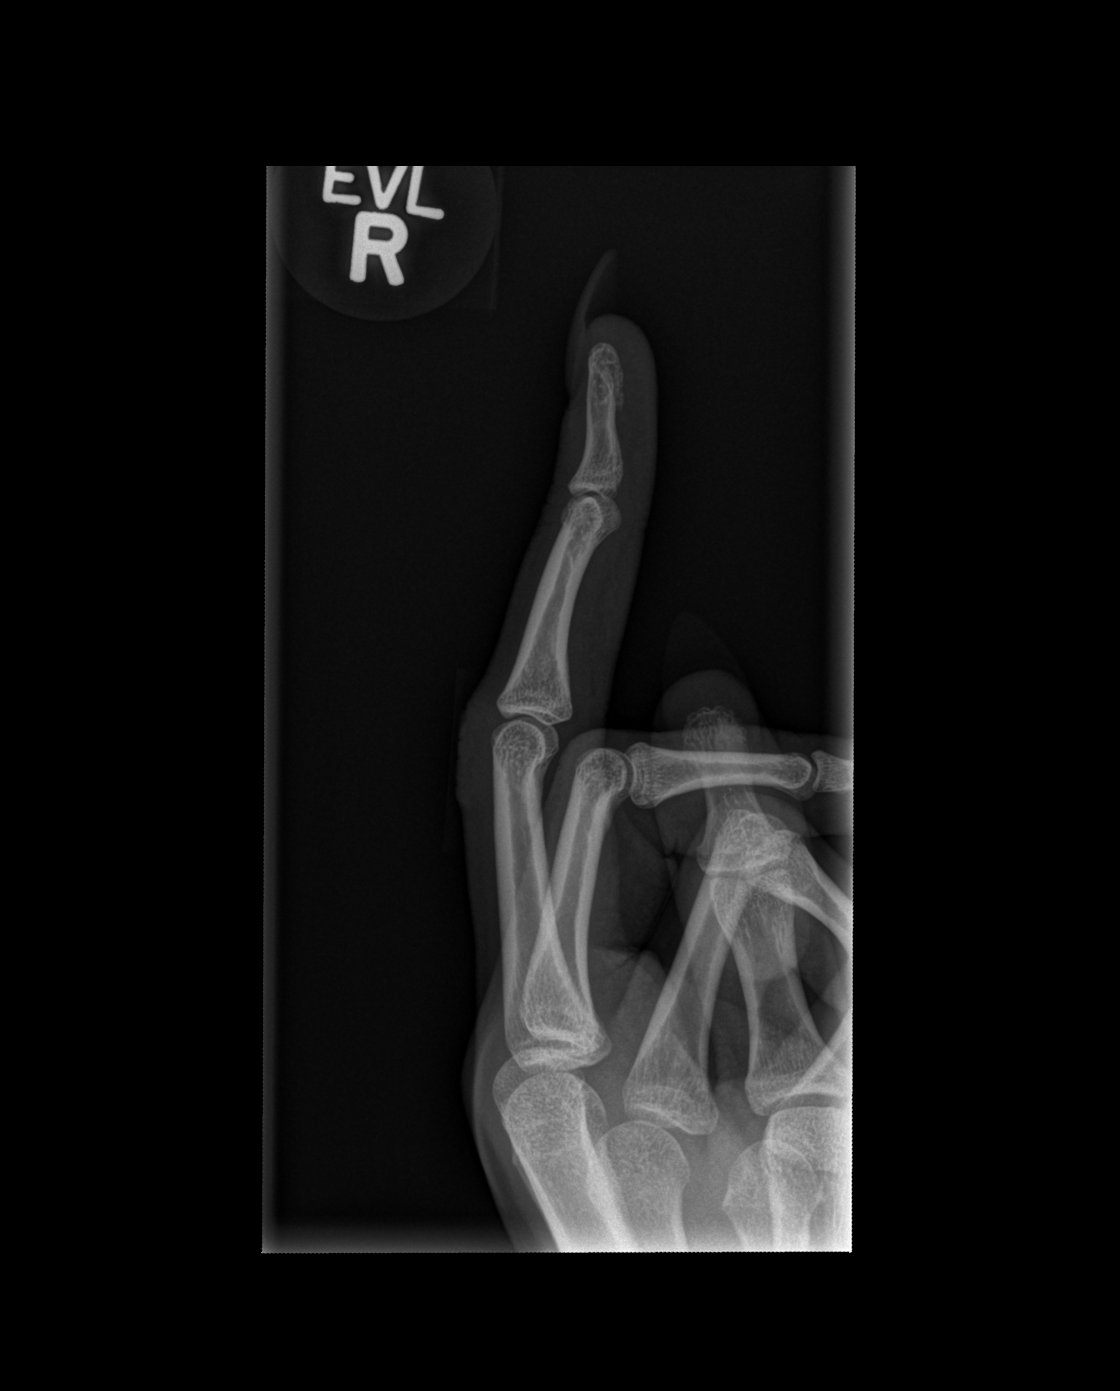

[3 of 3 positions shown; findings below may reference images not displayed]

FINDINGS: There is no acute fracture or dislocation. The bones are well
mineralized. No arthritic changes. Mild soft tissue swelling about
the PIP of the middle digit. No radiopaque foreign object or soft
tissue gas.
IMPRESSION: No acute/traumatic osseous pathology.

No radiopaque foreign object or soft tissue gas.
